# Patient Record
Sex: Female | Born: 2000 | Race: White | Hispanic: No | Marital: Single | Smoking: Never smoker
Health system: Southern US, Community
[De-identification: ages and names within clinical notes are randomized; demographics above are authoritative.]

---
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
## 2021-05-03 ENCOUNTER — Other Ambulatory Visit: Payer: Self-pay

## 2021-05-03 ENCOUNTER — Emergency Department (HOSPITAL_COMMUNITY)
Admission: EM | Admit: 2021-05-03 | Discharge: 2021-05-04 | Disposition: A | Discharge status: Home / Self Care | Payer: BC Managed Care – PPO | Attending: Emergency Medicine | Admitting: Emergency Medicine

## 2021-05-03 ENCOUNTER — Emergency Department (HOSPITAL_COMMUNITY): Payer: BC Managed Care – PPO

## 2021-05-03 DIAGNOSIS — R110 Nausea: Secondary | ICD-10-CM | POA: Insufficient documentation

## 2021-05-03 DIAGNOSIS — Z5321 Procedure and treatment not carried out due to patient leaving prior to being seen by health care provider: Secondary | ICD-10-CM | POA: Diagnosis not present

## 2021-05-03 DIAGNOSIS — R079 Chest pain, unspecified: Secondary | ICD-10-CM | POA: Insufficient documentation

## 2021-05-03 NOTE — ED Provider Notes (Signed)
Emergency Medicine Provider Triage Evaluation Note  Tanya Foster , a 20 y.o. female  was evaluated in triage.  Pt complains of cp and nausea. Pt states sxs began this afternoon. Cp is mid and to the R side. Associated nashua, no vomiting. pain worsens with inspiration. No PE risk factors. No infectious sxs  Review of Systems  Positive: Cp, nausea Negative: fever  Physical Exam  BP 127/82 (BP Location: Left Arm)   Pulse 96   Temp 98.4 F (36.9 C) (Oral)   Resp 15   SpO2 100%  Gen:   Awake, no distress   Resp:  Normal effort  MSK:   Moves extremities without difficulty  Other:  No ttp of the abd. Negative murphys. Ttp of the mid and R side chest wall  Medical Decision Making  Medically screening exam initiated at 11:25 PM.  Appropriate orders placed.  Tanya Foster was informed that the remainder of the evaluation will be completed by another provider, this initial triage assessment does not replace that evaluation, and the importance of remaining in the ED until their evaluation is complete.  Labs, ekg, and cxr ordered   Tanya Apley, PA-C 05/03/21 2326    Tanya Savoy, MD 05/04/21 [phone]

## 2021-05-03 NOTE — ED Triage Notes (Signed)
Pt states she has had chest pain today off and on that is accompanied by nausea.  Hx of acid reflux.  Hurts to take a deep breath.  Increasingly worse tonight.

## 2021-05-03 NOTE — ED Notes (Signed)
Pt states she has an emergency at home and needs to leave

## 2021-11-29 ENCOUNTER — Encounter (HOSPITAL_COMMUNITY): Payer: Self-pay | Admitting: Emergency Medicine

## 2021-11-29 ENCOUNTER — Other Ambulatory Visit: Payer: Self-pay

## 2021-11-29 ENCOUNTER — Emergency Department (HOSPITAL_COMMUNITY)
Admission: EM | Admit: 2021-11-29 | Discharge: 2021-11-29 | Disposition: A | Discharge status: Home / Self Care | Payer: Medicaid Other | Attending: Emergency Medicine | Admitting: Emergency Medicine

## 2021-11-29 DIAGNOSIS — O26892 Other specified pregnancy related conditions, second trimester: Secondary | ICD-10-CM | POA: Diagnosis not present

## 2021-11-29 DIAGNOSIS — O219 Vomiting of pregnancy, unspecified: Secondary | ICD-10-CM | POA: Insufficient documentation

## 2021-11-29 DIAGNOSIS — R079 Chest pain, unspecified: Secondary | ICD-10-CM | POA: Insufficient documentation

## 2021-11-29 DIAGNOSIS — Z3A26 26 weeks gestation of pregnancy: Secondary | ICD-10-CM | POA: Diagnosis not present

## 2021-11-29 MED ORDER — FAMOTIDINE 20 MG PO TABS: 20.0000 mg | ORAL_TABLET | Freq: Two times a day (BID) | ORAL | 3 refills | Status: AC

## 2021-11-29 MED ORDER — ONDANSETRON HCL 4 MG PO TABS: 4.0000 mg | ORAL_TABLET | Freq: Four times a day (QID) | ORAL | 0 refills | Status: AC

## 2021-11-29 NOTE — ED Notes (Addendum)
Fetal heart heart tones 152 to 159

## 2021-11-29 NOTE — ED Triage Notes (Signed)
Seen at [facility] 4 days ago for mid chest pains. Has been intermittent since. C/o some dizziness/n/v/headaches. States pain in mid chest radiates into both shoulders. Denies diaphoresis. Nad. Color wnl. [redacted] weeks pregnant.

## 2021-11-29 NOTE — ED Notes (Signed)
Ekg given to Dr Lynelle Doctor

## 2021-11-29 NOTE — ED Provider Notes (Signed)
[facility] EMERGENCY DEPARTMENT Provider Note   CSN: [id number] Arrival date & time: 11/29/21  2024     History  Chief Complaint  Patient presents with   Chest Pain    Tanya Foster is a 21 y.o. female.  Patient presents to the emergency department for evaluation of chest pain.  Patient has been having frequent chest pain recently.  She is [redacted] weeks pregnant.  Patient reports that she was seen at [facility] several days ago and had a work-up.  Chest pain persists.  She would like to know what is causing her pain.  Patient indicates that she is having frequent nausea and vomiting with the pregnancy.  She does have a history of GERD and indigestion, uses Tums intermittently.      Home Medications Prior to Admission medications   Medication Sig Start Date End Date Taking? Authorizing Provider  famotidine (PEPCID) 20 MG tablet Take 1 tablet (20 mg total) by mouth 2 (two) times daily. 11/29/21  Yes Katieann Hungate, Canary Brim, MD  ondansetron (ZOFRAN) 4 MG tablet Take 1 tablet (4 mg total) by mouth every 6 (six) hours. 11/29/21  Yes Gaynor Ferreras, Canary Brim, MD  ibuprofen (ADVIL) 800 MG tablet Take 1 tablet (800 mg total) by mouth 3 (three) times daily. 02/09/20   Avegno, Zachery Dakins, FNP      Allergies    Patient has no known allergies.    Review of Systems   Review of Systems  Physical Exam Updated Vital Signs BP 118/86    Pulse 89    Temp 97.9 F (36.6 C) (Oral)    Resp 16    SpO2 100%  Physical Exam Vitals and nursing note reviewed.  Constitutional:      General: She is not in acute distress.    Appearance: She is well-developed.  HENT:     Head: Normocephalic and atraumatic.     Mouth/Throat:     Mouth: Mucous membranes are moist.  Eyes:     General: Vision grossly intact. Gaze aligned appropriately.     Extraocular Movements: Extraocular movements intact.     Conjunctiva/sclera: Conjunctivae normal.  Cardiovascular:     Rate and Rhythm: Normal rate and regular rhythm.      Pulses: Normal pulses.     Heart sounds: Normal heart sounds, S1 normal and S2 normal. No murmur heard.   No friction rub. No gallop.  Pulmonary:     Effort: Pulmonary effort is normal. No respiratory distress.     Breath sounds: Normal breath sounds.  Abdominal:     General: Bowel sounds are normal.     Palpations: Abdomen is soft.     Tenderness: There is no abdominal tenderness. There is no guarding or rebound.     Hernia: No hernia is present.  Musculoskeletal:        General: No swelling.     Cervical back: Full passive range of motion without pain, normal range of motion and neck supple. No spinous process tenderness or muscular tenderness. Normal range of motion.     Right lower leg: No edema.     Left lower leg: No edema.  Skin:    General: Skin is warm and dry.     Capillary Refill: Capillary refill takes less than 2 seconds.     Findings: No ecchymosis, erythema, rash or wound.  Neurological:     General: No focal deficit present.     Mental Status: She is alert and oriented to person, place, and  time.     GCS: GCS eye subscore is 4. GCS verbal subscore is 5. GCS motor subscore is 6.     Cranial Nerves: Cranial nerves 2-12 are intact.     Sensory: Sensation is intact.     Motor: Motor function is intact.     Coordination: Coordination is intact.  Psychiatric:        Attention and Perception: Attention normal.        Mood and Affect: Mood normal.        Speech: Speech normal.        Behavior: Behavior normal.    ED Results / Procedures / Treatments   Labs (all labs ordered are listed, but only abnormal results are displayed) Labs Reviewed - No data to display  EKG EKG Interpretation  Date/Time:  Sunday November 29 2021 20:41:30 EST Ventricular Rate:  106 PR Interval:  128 QRS Duration: 76 QT Interval:  334 QTC Calculation: 443 R Axis:   95 Text Interpretation: Sinus tachycardia Right atrial enlargement Rightward axis Borderline ECG No previous ECGs  available Confirmed by Knapp, Jon ([id number]) on 11/29/2021 9:00:41 PM  Radiology No results found.  Procedures Procedures    Medications Ordered in ED Medications - No data to display  ED Course/ Medical Decision Making/ A&P                           Medical Decision Making Risk Prescription drug management.   Patient presents to the emergency department for evaluation of chest pain.  I did obtain records from previous ER visit at [facility].  She had a very thorough and appropriate work-up including CT scan that ruled out PE.  Patient indicates ongoing episodes of nausea and vomiting with the pregnancy as well as a previous history of GERD.  I suspect that her symptoms are GI in origin.  I recommended that she reinitiate Pepcid twice a day rather than simply using Tums reactively.  We will prescribe Zofran for ongoing issues of nausea and vomiting.  Apparently she did have a blood pressure value that was elevated at [facility].  Preeclampsia was suggested a possibility.  Her blood pressure is 118/86 today.  Preeclampsia is not in the differential diagnosis at this time.  She has follow-up scheduled with her OB/GYN, no further work-up necessary today.        Final Clinical Impression(s) / ED Diagnoses Final diagnoses:  Chest pain, unspecified type    Rx / DC Orders ED Discharge Orders          Ordered    ondansetron (ZOFRAN) 4 MG tablet  Every 6 hours        11/29/21 2336    famotidine (PEPCID) 20 MG tablet  2 times daily        02 /26/23 2336              [id number], MD 11/29/21 [phone]
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
## 2023-02-01 ENCOUNTER — Other Ambulatory Visit (HOSPITAL_BASED_OUTPATIENT_CLINIC_OR_DEPARTMENT_OTHER): Payer: Self-pay

## 2023-02-01 DIAGNOSIS — R5383 Other fatigue: Secondary | ICD-10-CM

## 2023-02-01 DIAGNOSIS — G4710 Hypersomnia, unspecified: Secondary | ICD-10-CM

## 2023-02-01 DIAGNOSIS — R0683 Snoring: Secondary | ICD-10-CM

## 2023-03-14 ENCOUNTER — Encounter: Payer: Medicaid Other | Admitting: Pulmonary Disease

## 2023-04-05 ENCOUNTER — Encounter (HOSPITAL_COMMUNITY): Payer: Self-pay

## 2023-04-05 ENCOUNTER — Emergency Department (HOSPITAL_COMMUNITY)
Admission: EM | Admit: 2023-04-05 | Discharge: 2023-04-06 | Disposition: A | Discharge status: Home / Self Care | Payer: Medicaid Other | Attending: Emergency Medicine | Admitting: Emergency Medicine

## 2023-04-05 DIAGNOSIS — R102 Pelvic and perineal pain: Secondary | ICD-10-CM | POA: Insufficient documentation

## 2023-04-05 DIAGNOSIS — R300 Dysuria: Secondary | ICD-10-CM | POA: Insufficient documentation

## 2023-04-05 DIAGNOSIS — R1011 Right upper quadrant pain: Secondary | ICD-10-CM | POA: Insufficient documentation

## 2023-04-05 DIAGNOSIS — R109 Unspecified abdominal pain: Secondary | ICD-10-CM | POA: Diagnosis present

## 2023-04-05 LAB — CBC
HCT: 39.2 % (ref 36.0–46.0)
Hemoglobin: 12.9 g/dL (ref 12.0–15.0)
MCH: 30.1 pg (ref 26.0–34.0)
MCHC: 32.9 g/dL (ref 30.0–36.0)
MCV: 91.6 fL (ref 80.0–100.0)
Platelets: 314 10*3/uL (ref 150–400)
RBC: 4.28 MIL/uL (ref 3.87–5.11)
RDW: 12.9 % (ref 11.5–15.5)
WBC: 8.4 10*3/uL (ref 4.0–10.5)
nRBC: 0 % (ref 0.0–0.2)

## 2023-04-05 LAB — COMPREHENSIVE METABOLIC PANEL
ALT: 32 U/L (ref 0–44)
AST: 23 U/L (ref 15–41)
Albumin: 3.8 g/dL (ref 3.5–5.0)
Alkaline Phosphatase: 65 U/L (ref 38–126)
Anion gap: 8 (ref 5–15)
BUN: 8 mg/dL (ref 6–20)
CO2: 25 mmol/L (ref 22–32)
Calcium: 9.7 mg/dL (ref 8.9–10.3)
Chloride: 102 mmol/L (ref 98–111)
Creatinine, Ser: 0.77 mg/dL (ref 0.44–1.00)
GFR, Estimated: >60 mL/min (ref >60)
Glucose, Bld: 85 mg/dL (ref 70–99)
Potassium: 4 mmol/L (ref 3.5–5.1)
Sodium: 135 mmol/L (ref 135–145)
Total Bilirubin: 0.5 mg/dL (ref 0.3–1.2)
Total Protein: 7.1 g/dL (ref 6.5–8.1)

## 2023-04-05 LAB — HCG, SERUM, QUALITATIVE: Preg, Serum: NEGATIVE

## 2023-04-05 LAB — LIPASE, BLOOD: Lipase: 28 U/L (ref 11–51)
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
## 2023-04-06 ENCOUNTER — Emergency Department (HOSPITAL_COMMUNITY)
Admission: EM | Admit: 2023-04-06 | Discharge: 2023-04-06 | Disposition: A | Discharge status: Home / Self Care | Payer: Medicaid Other | Source: Home / Self Care | Attending: Emergency Medicine | Admitting: Emergency Medicine

## 2023-04-06 ENCOUNTER — Other Ambulatory Visit: Payer: Self-pay

## 2023-04-06 ENCOUNTER — Emergency Department (HOSPITAL_COMMUNITY): Payer: Medicaid Other

## 2023-04-06 ENCOUNTER — Encounter (HOSPITAL_COMMUNITY): Payer: Self-pay | Admitting: Emergency Medicine

## 2023-04-06 DIAGNOSIS — R1011 Right upper quadrant pain: Secondary | ICD-10-CM | POA: Insufficient documentation

## 2023-04-06 DIAGNOSIS — R102 Pelvic and perineal pain: Secondary | ICD-10-CM | POA: Insufficient documentation

## 2023-04-06 LAB — PREGNANCY, URINE: Preg Test, Ur: NEGATIVE

## 2023-04-06 LAB — URINALYSIS, ROUTINE W REFLEX MICROSCOPIC
Bilirubin Urine: NEGATIVE
Bilirubin Urine: NEGATIVE
Glucose, UA: NEGATIVE mg/dL
Glucose, UA: NEGATIVE mg/dL
Hgb urine dipstick: NEGATIVE
Ketones, ur: NEGATIVE mg/dL
Ketones, ur: NEGATIVE mg/dL
Nitrite: NEGATIVE
Nitrite: NEGATIVE
Protein, ur: NEGATIVE mg/dL
Protein, ur: NEGATIVE mg/dL
Specific Gravity, Urine: 1.018 (ref 1.005–1.030)
Specific Gravity, Urine: 1.014 (ref 1.005–1.030)
WBC, UA: >50 WBC/hpf (ref 0–5)
pH: 5 (ref 5.0–8.0)
pH: 8 (ref 5.0–8.0)

## 2023-04-06 LAB — COMPREHENSIVE METABOLIC PANEL
ALT: 29 U/L (ref 0–44)
AST: 21 U/L (ref 15–41)
Albumin: 4 g/dL (ref 3.5–5.0)
Alkaline Phosphatase: 72 U/L (ref 38–126)
Anion gap: 8 (ref 5–15)
BUN: 10 mg/dL (ref 6–20)
CO2: 26 mmol/L (ref 22–32)
Calcium: 9.7 mg/dL (ref 8.9–10.3)
Chloride: 102 mmol/L (ref 98–111)
Creatinine, Ser: 0.65 mg/dL (ref 0.44–1.00)
GFR, Estimated: >60 mL/min (ref >60)
Glucose, Bld: 134 mg/dL — ABNORMAL HIGH (ref 70–99)
Potassium: 3.9 mmol/L (ref 3.5–5.1)
Sodium: 136 mmol/L (ref 135–145)
Total Bilirubin: 0.5 mg/dL (ref 0.3–1.2)
Total Protein: 7.4 g/dL (ref 6.5–8.1)

## 2023-04-06 LAB — CBC
HCT: 42.6 % (ref 36.0–46.0)
Hemoglobin: 14.1 g/dL (ref 12.0–15.0)
MCH: 30.4 pg (ref 26.0–34.0)
MCHC: 33.1 g/dL (ref 30.0–36.0)
MCV: 91.8 fL (ref 80.0–100.0)
Platelets: 337 10*3/uL (ref 150–400)
RBC: 4.64 MIL/uL (ref 3.87–5.11)
RDW: 13.1 % (ref 11.5–15.5)
WBC: 7.5 10*3/uL (ref 4.0–10.5)
nRBC: 0 % (ref 0.0–0.2)

## 2023-04-06 LAB — LIPASE, BLOOD: Lipase: 34 U/L (ref 11–51)

## 2023-04-06 MED ORDER — PANTOPRAZOLE SODIUM 40 MG PO TBEC: 40.0000 mg | DELAYED_RELEASE_TABLET | Freq: Every day | ORAL | 0 refills | Status: AC

## 2023-04-06 MED ORDER — CEPHALEXIN 500 MG PO CAPS: 500.0000 mg | ORAL_CAPSULE | Freq: Three times a day (TID) | ORAL | 0 refills | Status: AC

## 2023-04-06 MED ORDER — CEPHALEXIN 250 MG PO CAPS
500.0000 mg | ORAL_CAPSULE | Freq: Once | ORAL | Status: AC
  Administered 2023-04-06: 500 mg via ORAL
  Filled 2023-04-06: qty 2

## 2023-04-06 NOTE — Discharge Instructions (Addendum)
You were seen today for abdominal pain and bladder pain when you pee.  You will be treated for urinary tract infection.  Follow-up with your OB/GYN if symptoms persist.  Take ibuprofen as needed for pain.

## 2023-04-06 NOTE — ED Provider Notes (Signed)
[place] EMERGENCY DEPARTMENT AT [facility] Provider Note   CSN: [id number] Arrival date & time: 04/06/23  1415     History  Chief Complaint  Patient presents with   Pelvic Pain   Abdominal Pain    Tanya Foster is a 22 y.o. female with no significant past med history presenting with a 5-day history of chronic constant midline low pelvic pain, although she describes a year-long history of right upper quadrant pain, she has recently been taken off of her Depo-Provera, initially thought she was getting ready to have a period but has had no menses despite escalating midline pain, described as uterus cramping.  She was seen at [facility] yesterday evening at which time laboratory tests were unremarkable and she had an right upper quadrant ultrasound which was negative for gallbladder pathology.  She denies risk for STDs, she denies vaginal discharge, denies nausea or vomiting, back or flank pain, fevers or chills.  She states she takes Pepto-Bismol daily after eating meals, particularly greasy food will trigger her right upper abdominal pain.  She was started on Keflex at last night's ED visit given urinary complaints and equivocal UA, culture pending at this time.  The history is provided by the patient.       Home Medications Prior to Admission medications   Medication Sig Start Date End Date Taking? Authorizing Provider  cephALEXin (KEFLEX) 500 MG capsule Take 1 capsule (500 mg total) by mouth 3 (three) times daily. 04/06/23   Horton, Mayer Masker, MD  famotidine (PEPCID) 20 MG tablet Take 1 tablet (20 mg total) by mouth 2 (two) times daily. 11/29/21   Gilda Crease, MD  ibuprofen (ADVIL) 800 MG tablet Take 1 tablet (800 mg total) by mouth 3 (three) times daily. 02/09/20   Avegno, Zachery Dakins, FNP  ondansetron (ZOFRAN) 4 MG tablet Take 1 tablet (4 mg total) by mouth every 6 (six) hours. 11/29/21   Gilda Crease, MD      Allergies    Patient has no known allergies.     Review of Systems   Review of Systems  Respiratory: Negative.    All other systems reviewed and are negative.   Physical Exam Updated Vital Signs BP 117/68   Pulse 94   Temp 98.7 F (37.1 C) (Oral)   Resp 18   Ht 5\' 5"  (1.651 m)   Wt 83.5 kg   LMP 09/11/2022 (Approximate)   SpO2 98%   Breastfeeding No   BMI 30.62 kg/m  Physical Exam Vitals and nursing note reviewed.  Constitutional:      Appearance: She is well-developed.  HENT:     Head: Normocephalic and atraumatic.  Eyes:     Conjunctiva/sclera: Conjunctivae normal.  Cardiovascular:     Rate and Rhythm: Regular rhythm.     Heart sounds: Normal heart sounds.  Pulmonary:     Effort: Pulmonary effort is normal.     Breath sounds: Normal breath sounds. No wheezing.  Abdominal:     General: Bowel sounds are normal.     Palpations: Abdomen is soft.     Tenderness: There is abdominal tenderness in the right upper quadrant and suprapubic area. There is no guarding. Negative signs include Murphy's sign.     Comments: Mild suprapubic comfort, moderate tenderness right upper quadrant without guarding, negative Murphy sign.  Musculoskeletal:        General: Normal range of motion.     Cervical back: Normal range of motion.  Skin:  General: Skin is warm and dry.  Neurological:     Mental Status: She is alert.     ED Results / Procedures / Treatments   Labs (all labs ordered are listed, but only abnormal results are displayed) Labs Reviewed  COMPREHENSIVE METABOLIC PANEL - Abnormal; Notable for the following components:      Result Value   Glucose, Bld 134 (*)    All other components within normal limits  URINALYSIS, ROUTINE W REFLEX MICROSCOPIC - Abnormal; Notable for the following components:   APPearance CLOUDY (*)    Leukocytes,Ua SMALL (*)    Bacteria, UA FEW (*)    All other components within normal limits  WET PREP, GENITAL  LIPASE, BLOOD  CBC  PREGNANCY, URINE  GC/CHLAMYDIA PROBE AMP (Vivian)  NOT AT [facility]    EKG None  Radiology CT ABDOMEN PELVIS WO CONTRAST  Result Date: 04/06/2023 CLINICAL DATA:  Abdominal pain. EXAM: CT ABDOMEN AND PELVIS WITHOUT CONTRAST TECHNIQUE: Multidetector CT imaging of the abdomen and pelvis was performed following the standard protocol without IV contrast. RADIATION DOSE REDUCTION: This exam was performed according to the departmental dose-optimization program which includes automated exposure control, adjustment of the mA and/or kV according to patient size and/or use of iterative reconstruction technique. COMPARISON:  None Available. FINDINGS: Evaluation of this exam is limited in the absence of intravenous contrast. Lower chest: The visualized lung bases are clear. No intra-abdominal free air or free fluid. Hepatobiliary: No focal liver abnormality is seen. No gallstones, gallbladder wall thickening, or biliary dilatation. Pancreas: Unremarkable. No pancreatic ductal dilatation or surrounding inflammatory changes. Spleen: Normal in size without focal abnormality. Adrenals/Urinary Tract: The adrenal glands are unremarkable. There is no hydronephrosis or nephrolithiasis on either side. The visualized ureters and urinary bladder appear unremarkable. Stomach/Bowel: There is no bowel obstruction or active inflammation. Appendectomy. Vascular/Lymphatic: The abdominal aorta and IVC are grossly unremarkable on this noncontrast CT. No portal venous gas. There is no adenopathy. Reproductive: The uterus and ovaries are grossly unremarkable. No pelvic mass. Other: None Musculoskeletal: No acute or significant osseous findings. IMPRESSION: 1. No acute intra-abdominal or pelvic pathology. No hydronephrosis or nephrolithiasis. 2. Appendectomy. Electronically Signed   By: Elgie Collard M.D.   On: 04/06/2023 20:05   US Abdomen Limited RUQ (LIVER/GB)  Result Date: 04/06/2023 CLINICAL DATA:  Right upper quadrant pain EXAM: ULTRASOUND ABDOMEN LIMITED RIGHT UPPER QUADRANT  COMPARISON:  None Available. FINDINGS: Gallbladder: No gallstones or wall thickening visualized. No sonographic Murphy sign noted by sonographer. Common bile duct: Diameter: Normal caliber, 2 mm Liver: No focal lesion identified. Within normal limits in parenchymal echogenicity. Portal vein is patent on color Doppler imaging with normal direction of blood flow towards the liver. Other: None. IMPRESSION: Normal study. Electronically Signed   By: Charlett Nose M.D.   On: 04/06/2023 00:49    Procedures Procedures    Medications Ordered in ED Medications - No data to display  ED Course/ Medical Decision Making/ A&P                             Medical Decision Making Patient presenting with what she describes as a 1 year history of right upper quadrant abdominal pain which is triggered by eating fatty meals.  She takes Pepto-Bismol daily, was seen at [facility] ED yesterday and had a normal right upper quadrant ultrasound.  Her lab tests today including her LFTs and her lipase and her white blood cell  count are all normal and reassuring.  She also has complaints of suprapubic cramping, she was diagnosed with a UTI and started on Keflex yesterday.  She is encouraged to complete the antibiotic.  Patient being referred to GI for further evaluation of her gallbladder, she may benefit from a HIDA scan, in the interim she was advised to avoid high fatty foods which will trigger gallbladder symptoms.  Amount and/or Complexity of Data Reviewed Labs: ordered.    Details: Labs are stable, c-Met normal except for elevation and a glucose of 134, her lipase is normal, LFTs are normal she has a normal WBC count.  Her urine is cloudy, small amount of leukocytes and few bacteria this was a clean-catch specimen.  She is currently on Keflex. Radiology: ordered.    Details: CT abdomen and pelvis today negative for acute findings.  Ultrasound from yesterday reviewed, also negative.  Risk Prescription drug management. Risk  Details: She is being placed on Protonix to rule out GERD as source of symptoms.           Final Clinical Impression(s) / ED Diagnoses Final diagnoses:  RUQ abdominal pain    Rx / DC Orders ED Discharge Orders     None         Victoriano Lain 04/09/23 1313    Vanetta Mulders, MD 04/14/23 2245

## 2023-04-06 NOTE — ED Provider Notes (Signed)
[place] EMERGENCY DEPARTMENT AT [facility] Provider Note   CSN: [id number] Arrival date & time: 04/05/23  2034     History  Chief Complaint  Patient presents with   Abdominal Pain    Tanya Foster is a 22 y.o. female.  HPI     This is a 22 year old female who presents with abdominal pain.  Patient reports that she recently came off Depo-Provera.  Over the last 4 days she has had some right upper quadrant abdominal pain and lower abdominal cramping that she describes as "uterus cramping."  She states that she thought she was going to get her period but that she has not had any bleeding.  She was seen and evaluated at urgent care and describes a physical exam where she was tender in the right upper quadrant.  There was some concern for gallbladder pathology.  She states that she previously had been evaluated for gallbladder disease but had deferred any surgical intervention or other workup.  She does describe the pain is postprandial in nature.  She reports normal bowel movements without nausea or vomiting.  She does report some discomfort with urination.  Denies any vaginal discharge or concerns for STDs.  Reports she is in a monogamous relationship.  Home Medications Prior to Admission medications   Medication Sig Start Date End Date Taking? Authorizing Provider  famotidine (PEPCID) 20 MG tablet Take 1 tablet (20 mg total) by mouth 2 (two) times daily. 11/29/21   Gilda Crease, MD  ibuprofen (ADVIL) 800 MG tablet Take 1 tablet (800 mg total) by mouth 3 (three) times daily. 02/09/20   Avegno, Zachery Dakins, FNP  ondansetron (ZOFRAN) 4 MG tablet Take 1 tablet (4 mg total) by mouth every 6 (six) hours. 11/29/21   Gilda Crease, MD      Allergies    Patient has no known allergies.    Review of Systems   Review of Systems  Constitutional:  Negative for fever.  Respiratory:  Negative for shortness of breath.   Cardiovascular:  Negative for chest pain.   Gastrointestinal:  Positive for abdominal pain. Negative for diarrhea, nausea and vomiting.  Genitourinary:  Negative for vaginal discharge.  All other systems reviewed and are negative.   Physical Exam Updated Vital Signs BP 123/67   Pulse 87   Temp 98.3 F (36.8 C) (Oral)   Resp 16   Ht 1.651 m (5\' 5" )   Wt 83.5 kg   SpO2 100%   BMI 30.62 kg/m  Physical Exam Vitals and nursing note reviewed.  Constitutional:      Appearance: She is well-developed. She is not ill-appearing.  HENT:     Head: Normocephalic and atraumatic.  Eyes:     Pupils: Pupils are equal, round, and reactive to light.  Cardiovascular:     Rate and Rhythm: Normal rate and regular rhythm.     Heart sounds: Normal heart sounds.  Pulmonary:     Effort: Pulmonary effort is normal. No respiratory distress.     Breath sounds: No wheezing.  Abdominal:     General: Bowel sounds are normal.     Palpations: Abdomen is soft.     Tenderness: There is abdominal tenderness in the right upper quadrant. There is no guarding or rebound.  Musculoskeletal:     Cervical back: Neck supple.  Skin:    General: Skin is warm and dry.  Neurological:     Mental Status: She is alert and oriented to person, place, and  time.  Psychiatric:        Mood and Affect: Mood normal.     ED Results / Procedures / Treatments   Labs (all labs ordered are listed, but only abnormal results are displayed) Labs Reviewed  URINALYSIS, ROUTINE W REFLEX MICROSCOPIC - Abnormal; Notable for the following components:      Result Value   APPearance CLOUDY (*)    Hgb urine dipstick SMALL (*)    Leukocytes,Ua LARGE (*)    Bacteria, UA RARE (*)    All other components within normal limits  URINE CULTURE  LIPASE, BLOOD  COMPREHENSIVE METABOLIC PANEL  CBC  HCG, SERUM, QUALITATIVE    EKG None  Radiology US Abdomen Limited RUQ (LIVER/GB)  Result Date: 04/06/2023 CLINICAL DATA:  Right upper quadrant pain EXAM: ULTRASOUND ABDOMEN LIMITED  RIGHT UPPER QUADRANT COMPARISON:  None Available. FINDINGS: Gallbladder: No gallstones or wall thickening visualized. No sonographic Murphy sign noted by sonographer. Common bile duct: Diameter: Normal caliber, 2 mm Liver: No focal lesion identified. Within normal limits in parenchymal echogenicity. Portal vein is patent on color Doppler imaging with normal direction of blood flow towards the liver. Other: None. IMPRESSION: Normal study. Electronically Signed   By: Charlett Nose M.D.   On: 04/06/2023 00:49    Procedures Procedures    Medications Ordered in ED Medications  cephALEXin (KEFLEX) capsule 500 mg (has no administration in time range)    ED Course/ Medical Decision Making/ A&P                             Medical Decision Making Amount and/or Complexity of Data Reviewed Labs: ordered. Radiology: ordered.  Risk Prescription drug management.   This patient presents to the ED for concern of abdominal pain, this involves an extensive number of treatment options, and is a complaint that carries with it a high risk of complications and morbidity.  I considered the following differential and admission for this acute, potentially life threatening condition.  The differential diagnosis includes gallbladder pathology, UTI, dysmenorrhea, appendicitis, Lynnae January  MDM:    This is a 22 year old female who presents with abdominal pain.  Initially described pain over the lower abdomen but is most tender in the right upper quadrant which prompted an ER evaluation from urgent care.  She is nontoxic-appearing and vital signs are reassuring.  On my clinical exam she is tender in the right upper quadrant and suprapubic tenderness.  Labs obtained and reviewed and are largely reassuring except for a large leukocyte Estrace with greater than 50 white cells and rare bacteria in the urine.  It is a dirty specimen.  However given dysuria with urination, we will send a culture and treat.  Right upper  quadrant ultrasound imaging does not reveal any gallbladder pathology.  Again discussed with patient concern for STDs.  She is adamant that she has not had any STDs.  Describes recent pelvic exam and ultrasound OB/GYN office.  STD testing deferred and have honestly low suspicion for PID or Lynnae January.  Will refer her back to her OB/GYN.  In the meantime we will treat for possible UTI.  (Labs, imaging, consults)  Labs: I Ordered, and personally interpreted labs.  The pertinent results include: CBC, CMP, lipase, beta-hCG  Imaging Studies ordered: I ordered imaging studies including right upper quadrant ultrasound I independently visualized and interpreted imaging. I agree with the radiologist interpretation  Additional history obtained from chart review.  External records  from outside source obtained and reviewed including prior evaluations  Cardiac Monitoring: The patient is not maintained on a cardiac monitor.  If on the cardiac monitor, I personally viewed and interpreted the cardiac monitored which showed an underlying rhythm of: N/A  Reevaluation: After the interventions noted above, I reevaluated the patient and found that they have :stayed the same  Social Determinants of Health:  lives independently  Disposition: Discharge  Co morbidities that complicate the patient evaluation History reviewed. No pertinent past medical history.   Medicines Meds ordered this encounter  Medications   cephALEXin (KEFLEX) capsule 500 mg    I have reviewed the patients home medicines and have made adjustments as needed  Problem List / ED Course: Problem List Items Addressed This Visit   None Visit Diagnoses     RUQ abdominal pain    -  Primary   Dysuria                       Final Clinical Impression(s) / ED Diagnoses Final diagnoses:  RUQ abdominal pain  Dysuria    Rx / DC Orders ED Discharge Orders     None         Shon Baton, MD 04/06/23  [phone]

## 2023-04-06 NOTE — ED Triage Notes (Signed)
Pt via POV c/o 5 days of constant pelvic pain after a year of intermittent discomfort. She states it feels like contractions. She went to UC yesterday and during the assessment, the provider palpated her abdomen and noted that they recommend further eval to rule out issues with gallbladder. Pt has felt nauseated but denies vomiting, diarrhea, constipation.

## 2023-04-06 NOTE — ED Notes (Signed)
Patient verbalizes understanding of discharge instructions. Opportunity for questioning and answers were provided. Armband removed by staff, pt discharged from ED. Pt ambulatory to ED waiting room with steady gait.  

## 2023-04-06 NOTE — Discharge Instructions (Signed)
Your lab test and CT imaging are reassuring today.  However as discussed you may be having intermittent spasms of your gallbladder causing pain which would not show up on CT imaging or with lab tests.  However you may need a HIDA scan as discussed to further evaluate whether your gallbladder is functioning properly.  I am referring you to our local gastroenterologist Dr. Jena Gauss for further evaluation of your symptoms.  Call for an appointment.  In the interim I recommend avoiding any fatty foods as high fat food consumption will trigger your gallbladder to want to squeeze.  You were placed on antibiotics at your ED visit yesterday, make sure you are completing this medication.

## 2023-04-07 LAB — URINE CULTURE: Culture: >=100000 — AB

## 2023-04-08 ENCOUNTER — Telehealth (HOSPITAL_BASED_OUTPATIENT_CLINIC_OR_DEPARTMENT_OTHER): Payer: Self-pay | Admitting: Emergency Medicine

## 2023-04-08 NOTE — Telephone Encounter (Signed)
Post ED Visit - Positive Culture Follow-up  Culture report reviewed by antimicrobial stewardship pharmacist: [facility] []  Enzo Bi, Pharm.D. []  Celedonio Miyamoto, Pharm.D., BCPS AQ-ID []  Garvin Fila, Pharm.D., BCPS []  Georgina Pillion, Pharm.D., BCPS []  [place], [place].D., BCPS, AAHIVP []  Estella Husk, Pharm.D., BCPS, AAHIVP []  Lysle Pearl, PharmD, BCPS []  Phillips Climes, PharmD, BCPS []  Agapito Games, PharmD, BCPS [x]  Daylene Posey, PharmD []  Mervyn Gay, PharmD, BCPS []  Vinnie Level, PharmD  [facility] []  Len Childs, PharmD []  Greer Pickerel, PharmD []  Adalberto Cole, PharmD []  Perlie Gold, Rph []  Lonell Face) Jean Rosenthal, PharmD []  Earl Many, PharmD []  Junita Push, PharmD []  Dorna Leitz, PharmD []  Terrilee Files, PharmD []  Lynann Beaver, PharmD []  Keturah Barre, PharmD []  Loralee Pacas, PharmD []  Bernadene Person, PharmD   Positive urine culture Treated with Cephalexin, organism sensitive to the same and no further patient follow-up is required at this time.  Tanya Foster 04/08/2023, 11:01 AM

## 2023-07-25 IMAGING — CR DG CHEST 2V
2 series · 2 of 2 positions shown · non-contrast
Comparison: None.

CLINICAL DATA: Chest pain.  Intermittent pain today with nausea.

EXAM:
CHEST - 2 VIEW

[chest pa]
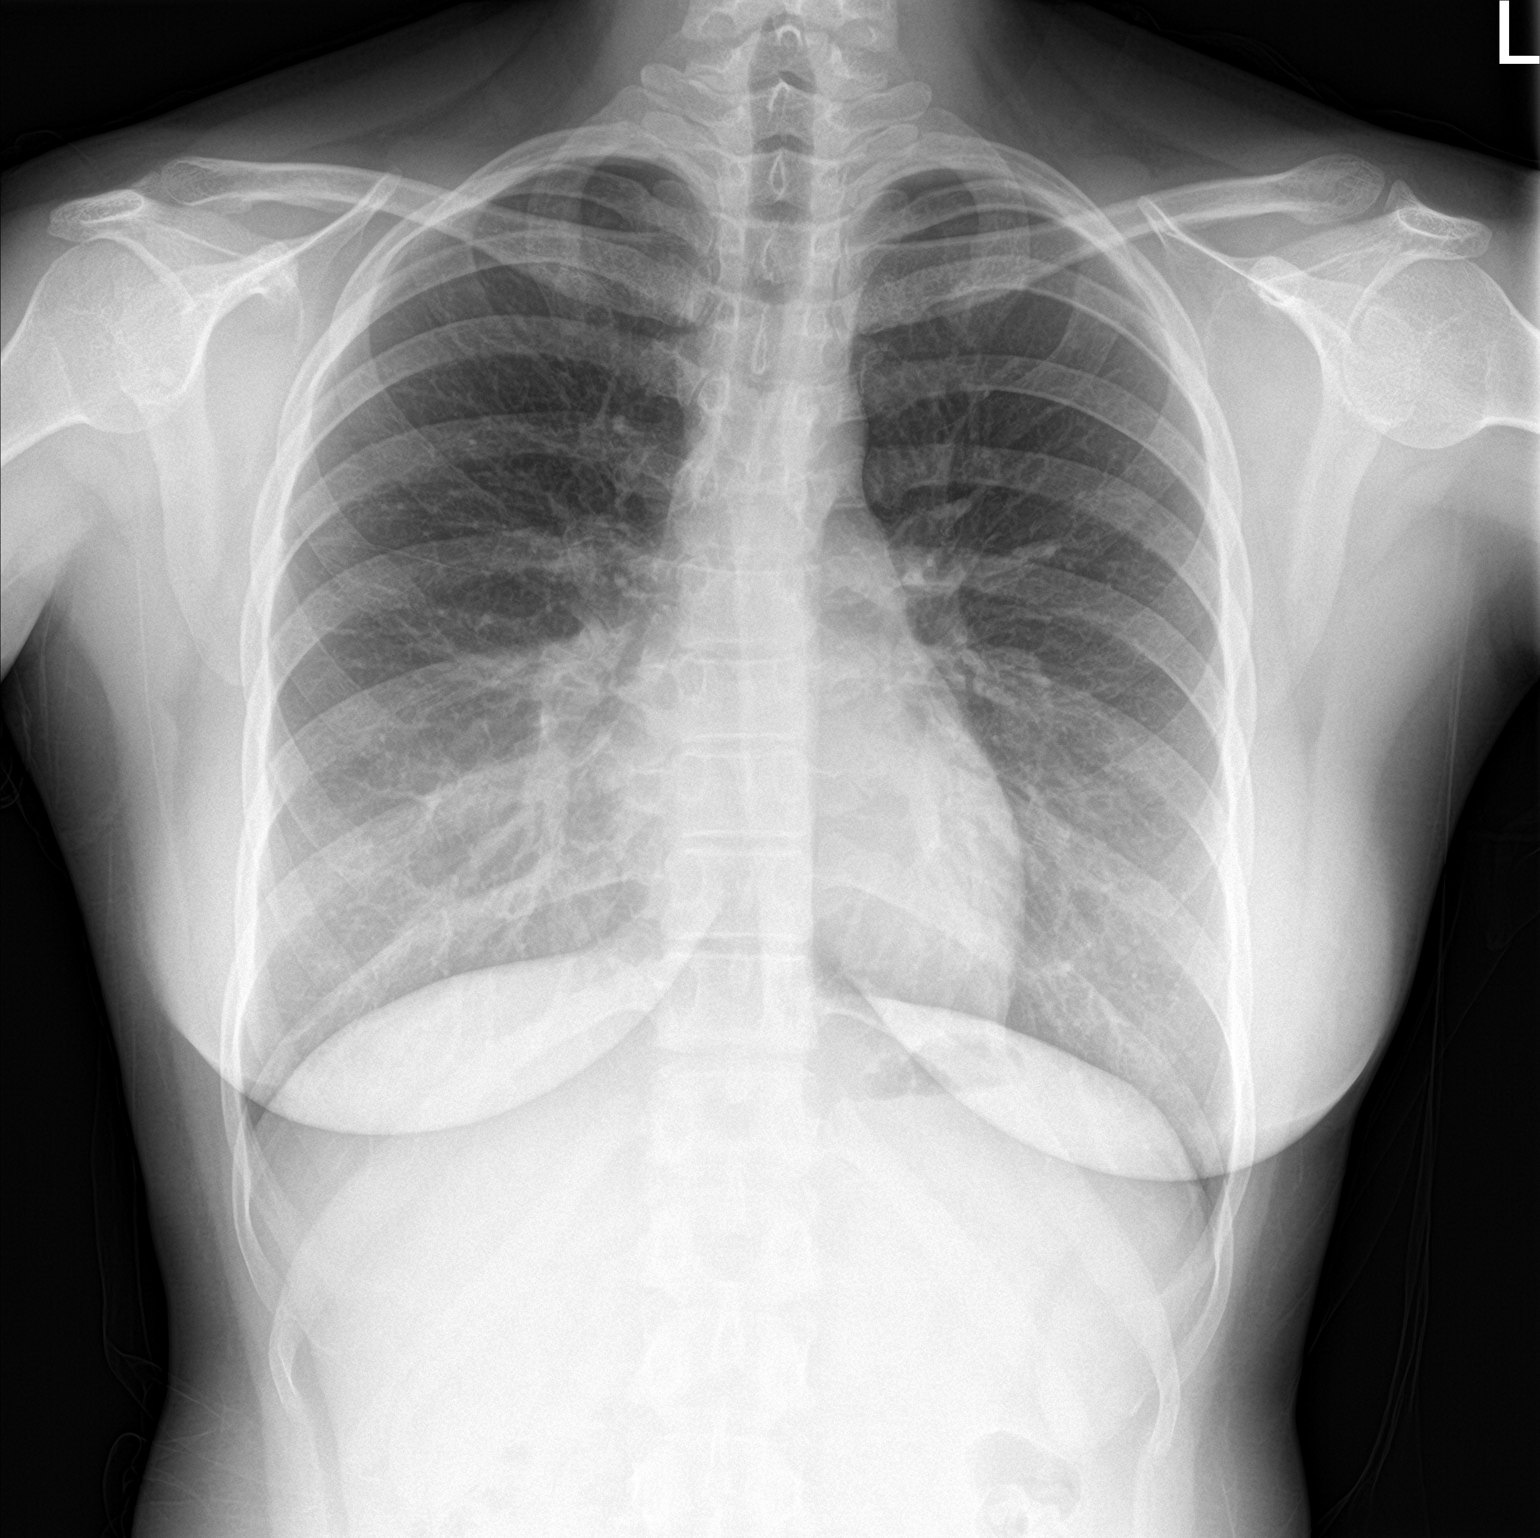

[chest lat]
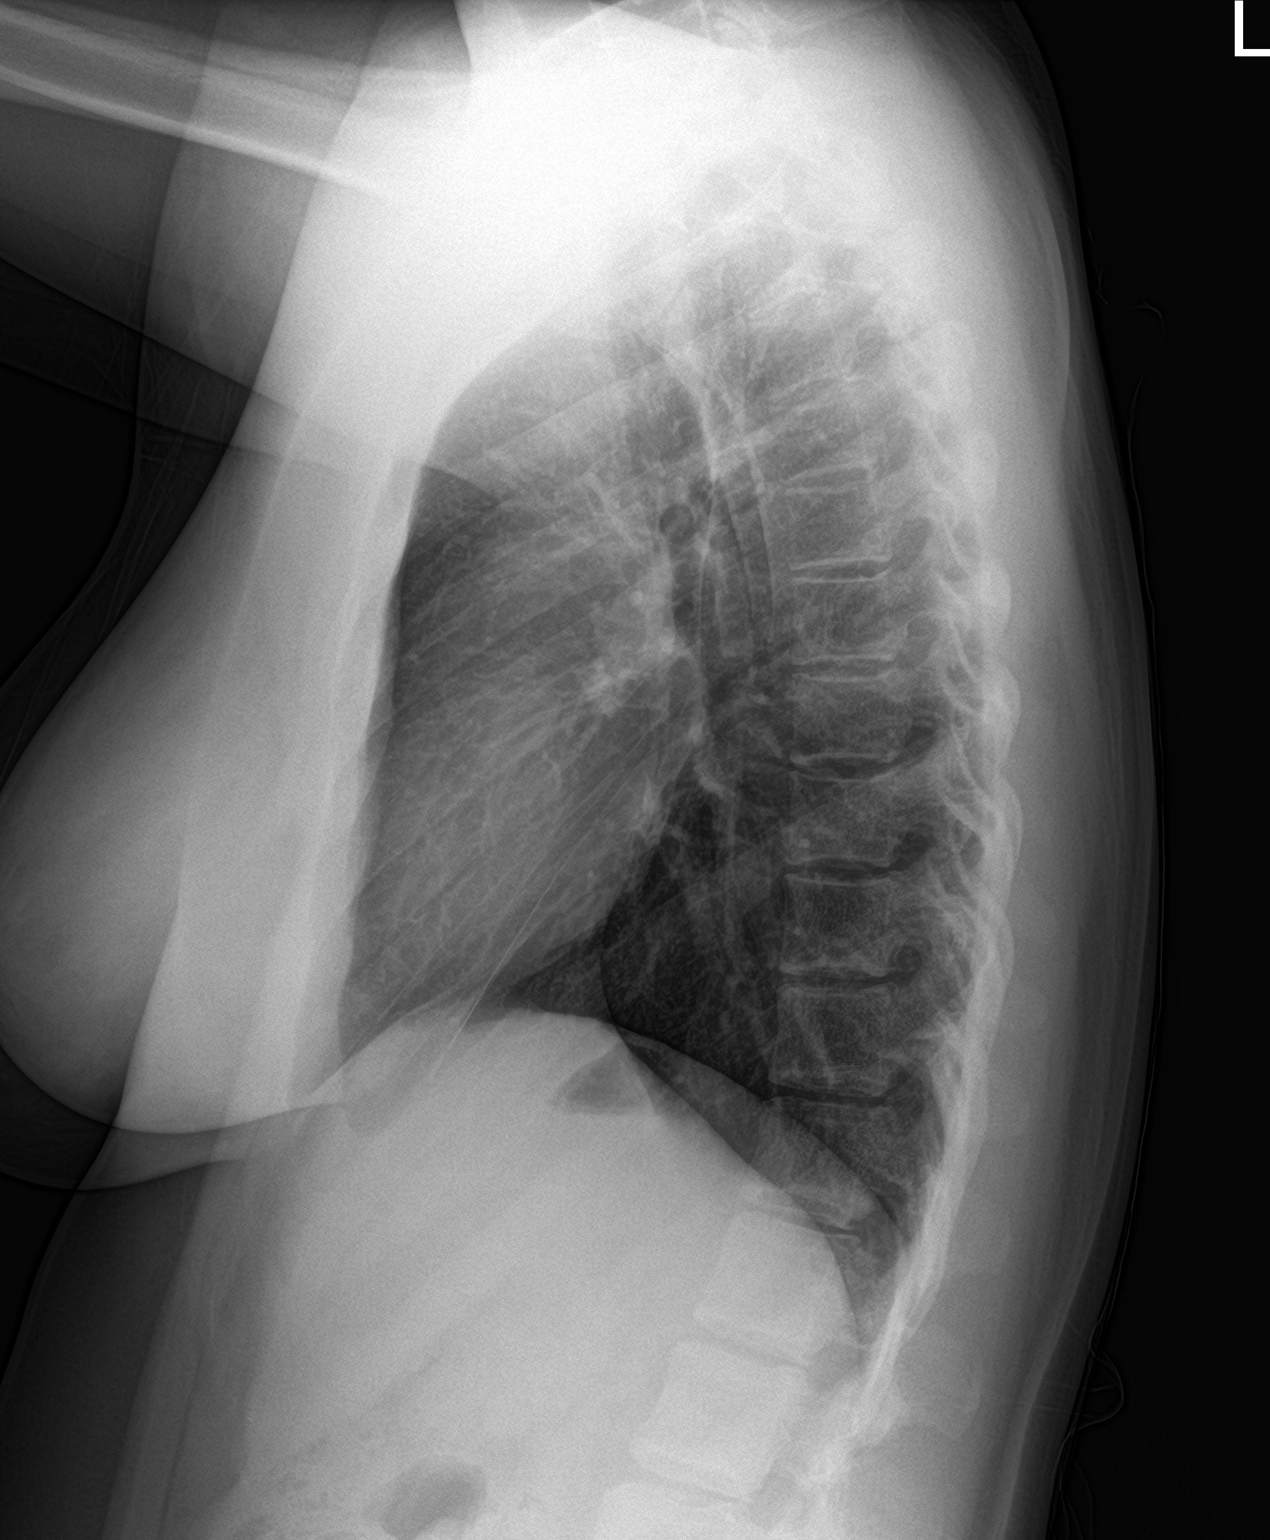

[2 of 2 positions shown; findings below may reference images not displayed]

FINDINGS: The cardiomediastinal contours are normal. The lungs are clear.
Pulmonary vasculature is normal. No consolidation, pleural effusion,
or pneumothorax. No acute osseous abnormalities are seen.
IMPRESSION: Negative radiographs of the chest.
